# Patient Record
Sex: Female | Born: 1985 | Race: Black or African American | Hispanic: No | Marital: Single | Smoking: Never smoker
Health system: Southern US, Community
[De-identification: ages and names within clinical notes are randomized; demographics above are authoritative.]

## PROBLEM LIST (undated history)

## (undated) ENCOUNTER — Inpatient Hospital Stay (HOSPITAL_COMMUNITY): Payer: Self-pay

## (undated) DIAGNOSIS — A749 Chlamydial infection, unspecified: Secondary | ICD-10-CM

## (undated) DIAGNOSIS — IMO0002 Reserved for concepts with insufficient information to code with codable children: Secondary | ICD-10-CM

---
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
## 2009-12-11 ENCOUNTER — Inpatient Hospital Stay (HOSPITAL_COMMUNITY): Admission: AD | Admit: 2009-12-11 | Discharge: 2009-12-11 | Payer: Self-pay | Admitting: Family Medicine

## 2010-11-04 LAB — URINALYSIS, ROUTINE W REFLEX MICROSCOPIC
Ketones, ur: >80 mg/dL — AB
Nitrite: NEGATIVE
Protein, ur: NEGATIVE mg/dL

## 2011-09-26 ENCOUNTER — Encounter (HOSPITAL_COMMUNITY): Payer: Self-pay | Admitting: *Deleted

## 2011-09-26 ENCOUNTER — Inpatient Hospital Stay (HOSPITAL_COMMUNITY)
Admission: AD | Admit: 2011-09-26 | Discharge: 2011-09-27 | Disposition: A | Discharge status: Home / Self Care | Payer: No Typology Code available for payment source | Source: Ambulatory Visit | Attending: Obstetrics & Gynecology | Admitting: Obstetrics & Gynecology

## 2011-09-26 DIAGNOSIS — O368190 Decreased fetal movements, unspecified trimester, not applicable or unspecified: Secondary | ICD-10-CM | POA: Insufficient documentation

## 2011-09-26 DIAGNOSIS — G8911 Acute pain due to trauma: Secondary | ICD-10-CM

## 2011-09-26 HISTORY — DX: Reserved for concepts with insufficient information to code with codable children: IMO0002

## 2011-09-26 LAB — CBC
MCH: 30 pg (ref 26.0–34.0)
MCHC: 33.9 g/dL (ref 30.0–36.0)
Platelets: 244 10*3/uL (ref 150–400)
RDW: 13.8 % (ref 11.5–15.5)

## 2011-09-26 LAB — ABO/RH: ABO/RH(D): B POS
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
## 2011-09-27 MED ORDER — IBUPROFEN 200 MG PO TABS: 400.0000 mg | ORAL_TABLET | Freq: Three times a day (TID) | ORAL | Status: AC | PRN

## 2011-09-30 ENCOUNTER — Emergency Department (HOSPITAL_COMMUNITY)
Admission: EM | Admit: 2011-09-30 | Discharge: 2011-09-30 | Disposition: A | Discharge status: Home / Self Care | Payer: No Typology Code available for payment source | Attending: Emergency Medicine | Admitting: Emergency Medicine

## 2011-09-30 ENCOUNTER — Encounter (HOSPITAL_COMMUNITY): Payer: Self-pay | Admitting: Emergency Medicine

## 2011-09-30 DIAGNOSIS — M545 Low back pain, unspecified: Secondary | ICD-10-CM | POA: Insufficient documentation

## 2011-09-30 DIAGNOSIS — Y92410 Unspecified street and highway as the place of occurrence of the external cause: Secondary | ICD-10-CM | POA: Insufficient documentation
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
## 2012-01-21 ENCOUNTER — Telehealth (HOSPITAL_COMMUNITY): Payer: Self-pay | Admitting: *Deleted

## 2012-01-22 NOTE — Telephone Encounter (Signed)
Preadmission screen  

## 2012-09-27 ENCOUNTER — Encounter (HOSPITAL_COMMUNITY): Payer: Self-pay | Admitting: *Deleted

## 2012-09-27 ENCOUNTER — Emergency Department (HOSPITAL_COMMUNITY)
Admission: EM | Admit: 2012-09-27 | Discharge: 2012-09-27 | Disposition: A | Discharge status: Home / Self Care | Payer: Medicaid Other | Attending: Emergency Medicine | Admitting: Emergency Medicine

## 2012-09-27 DIAGNOSIS — Y9389 Activity, other specified: Secondary | ICD-10-CM | POA: Insufficient documentation

## 2012-09-27 DIAGNOSIS — S90569A Insect bite (nonvenomous), unspecified ankle, initial encounter: Secondary | ICD-10-CM | POA: Insufficient documentation

## 2012-09-27 DIAGNOSIS — Z8739 Personal history of other diseases of the musculoskeletal system and connective tissue: Secondary | ICD-10-CM | POA: Insufficient documentation

## 2012-09-27 DIAGNOSIS — Y929 Unspecified place or not applicable: Secondary | ICD-10-CM | POA: Insufficient documentation

## 2012-09-27 DIAGNOSIS — L509 Urticaria, unspecified: Secondary | ICD-10-CM

## 2012-09-27 DIAGNOSIS — Z87891 Personal history of nicotine dependence: Secondary | ICD-10-CM | POA: Insufficient documentation

## 2012-09-27 DIAGNOSIS — W57XXXA Bitten or stung by nonvenomous insect and other nonvenomous arthropods, initial encounter: Secondary | ICD-10-CM

## 2012-09-27 DIAGNOSIS — S60569A Insect bite (nonvenomous) of unspecified hand, initial encounter: Secondary | ICD-10-CM | POA: Insufficient documentation

## 2012-09-27 DIAGNOSIS — L299 Pruritus, unspecified: Secondary | ICD-10-CM | POA: Insufficient documentation

## 2012-09-27 MED ORDER — PERMETHRIN 5 % EX CREA: TOPICAL_CREAM | CUTANEOUS | Status: DC

## 2012-09-27 MED ORDER — DIPHENHYDRAMINE HCL 25 MG PO CAPS
50.0000 mg | ORAL_CAPSULE | Freq: Once | ORAL | Status: AC
  Administered 2012-09-27: 50 mg via ORAL
  Filled 2012-09-27: qty 2

## 2012-09-27 MED ORDER — HYDROCORTISONE 1 % EX CREA: TOPICAL_CREAM | Freq: Two times a day (BID) | CUTANEOUS | Status: DC

## 2012-09-27 MED ORDER — DIPHENHYDRAMINE HCL 25 MG PO TABS: 25.0000 mg | ORAL_TABLET | Freq: Four times a day (QID) | ORAL | Status: DC | PRN

## 2012-09-27 NOTE — ED Provider Notes (Signed)
Medical screening examination/treatment/procedure(s) were performed by non-physician practitioner and as supervising physician I was immediately available for consultation/collaboration.   Loren Racer, MD 09/27/12 2330

## 2012-09-27 NOTE — ED Notes (Signed)
Hives to lower legs for a couple of days; itch; today developed hives to arms and small patch to face; no shortness of breath or resp involvement

## 2012-09-27 NOTE — ED Provider Notes (Signed)
History  This chart was scribed for non-physician practitioner working with Loren Racer, MD by Candelaria Stagers, ED Scribe. This patient was seen in room WTR9/WTR9 and the patient's care was started at 10:07 PM   CSN: [id number]  Arrival date & time 09/27/12  2114   First MD Initiated Contact with Patient 09/27/12 2156      Chief Complaint  Patient presents with  . Urticaria     The history is provided by the patient. No language interpreter was used.   Veronica Zhang is a 27 y.o. female who presents to the Emergency Department complaining of an itching rash to her legs, arms, and face that started several days ago.  She reports she first noticed the rash on her lower right leg following left lower arm, left upper arm, and left side of her face.  Pt reports that she has been sleeping somewhere new recently, but has used her own bedding.  Nothing seems to make her sx better or worse. States that the itch is severe. Pt denies pregnancy and is not currently breastfeeding.      Past Medical History  Diagnosis Date  . Ligament tear of lower extremity     in RT knee    Past Surgical History  Procedure Laterality Date  . Tonsillectomy  1993    Family History  Problem Relation Age of Onset  . Anesthesia problems Neg Hx   . Hypotension Neg Hx   . Malignant hyperthermia Neg Hx   . Pseudochol deficiency Neg Hx     History  Substance Use Topics  . Smoking status: Former Games developer  . Smokeless tobacco: Not on file  . Alcohol Use: Yes     Comment: "Last consumption was over 9 months."    OB History   Grav Para Term Preterm Abortions TAB SAB Ect Mult Living   3    2 2     0      Review of Systems  Skin: Positive for rash (right leg, left arm, and left side of face).  All other systems reviewed and are negative.    Allergies  Penicillins  Home Medications  No current outpatient prescriptions on file.  BP 121/78  Pulse 66  Temp(Src) 98.6 F (37 C) (Oral)  Resp 18   Ht 5\' 7"  (1.702 m)  Wt 128 lb (58.06 kg)  BMI 20.04 kg/m2  SpO2 100%  LMP 09/20/2012  Breastfeeding? Unknown  Physical Exam  Nursing note and vitals reviewed. Constitutional: She is oriented to person, place, and time. She appears well-developed and well-nourished. No distress.  HENT:  Head: Normocephalic and atraumatic.  Eyes: EOM are normal.  Neck: Neck supple. No tracheal deviation present.  Cardiovascular: Normal rate.   Pulmonary/Chest: Effort normal. No respiratory distress.  Musculoskeletal: Normal range of motion.  Neurological: She is alert and oriented to person, place, and time.  Skin: Skin is warm and dry.  Multiple 2 x 2 cm bites with surrounding redness scattered over lower extremities and upper extremities.    Psychiatric: She has a normal mood and affect. Her behavior is normal.    ED Course  Procedures   DIAGNOSTIC STUDIES: Oxygen Saturation is 100% on room air, normal by my interpretation.    COORDINATION OF CARE:  10:12 PM Will prescribe permethrin cream to treat for bed bugs.  Advised pt to return if sx have not improved in two weeks or she begins to experience difficulty breathing or worsening symptoms.  Pt understands and agrees.  Labs Reviewed - No data to display No results found.   1. Bug bites   2. Urticaria       MDM  Patient's rash looks like bedbugs scabies, will treat with permethrin hydrocortisone cream. Also encouraged patient to wash her sheets and take Benadryl for symptoms. I personally performed the services described in this documentation, which was scribed in my presence. The recorded information has been reviewed and is accurate.         Roxy Horseman, PA-C 09/27/12 2240

## 2012-10-29 ENCOUNTER — Emergency Department (HOSPITAL_COMMUNITY)
Admission: EM | Admit: 2012-10-29 | Discharge: 2012-10-29 | Disposition: A | Discharge status: Home Health | Payer: Medicaid Other | Attending: Emergency Medicine | Admitting: Emergency Medicine

## 2012-10-29 ENCOUNTER — Encounter (HOSPITAL_COMMUNITY): Payer: Self-pay | Admitting: Emergency Medicine

## 2012-10-29 ENCOUNTER — Emergency Department (HOSPITAL_COMMUNITY): Payer: Medicaid Other

## 2012-10-29 DIAGNOSIS — R42 Dizziness and giddiness: Secondary | ICD-10-CM | POA: Insufficient documentation

## 2012-10-29 DIAGNOSIS — R531 Weakness: Secondary | ICD-10-CM

## 2012-10-29 DIAGNOSIS — R296 Repeated falls: Secondary | ICD-10-CM | POA: Insufficient documentation

## 2012-10-29 DIAGNOSIS — Y929 Unspecified place or not applicable: Secondary | ICD-10-CM | POA: Insufficient documentation

## 2012-10-29 DIAGNOSIS — R34 Anuria and oliguria: Secondary | ICD-10-CM | POA: Insufficient documentation

## 2012-10-29 DIAGNOSIS — IMO0002 Reserved for concepts with insufficient information to code with codable children: Secondary | ICD-10-CM | POA: Insufficient documentation

## 2012-10-29 DIAGNOSIS — Y9389 Activity, other specified: Secondary | ICD-10-CM | POA: Insufficient documentation

## 2012-10-29 DIAGNOSIS — R5381 Other malaise: Secondary | ICD-10-CM | POA: Insufficient documentation

## 2012-10-29 DIAGNOSIS — N390 Urinary tract infection, site not specified: Secondary | ICD-10-CM | POA: Insufficient documentation

## 2012-10-29 DIAGNOSIS — S62619A Displaced fracture of proximal phalanx of unspecified finger, initial encounter for closed fracture: Secondary | ICD-10-CM

## 2012-10-29 DIAGNOSIS — Z3202 Encounter for pregnancy test, result negative: Secondary | ICD-10-CM | POA: Insufficient documentation

## 2012-10-29 DIAGNOSIS — Z87828 Personal history of other (healed) physical injury and trauma: Secondary | ICD-10-CM | POA: Insufficient documentation

## 2012-10-29 LAB — CBC
Hemoglobin: 14.1 g/dL (ref 12.0–15.0)
MCH: 28.9 pg (ref 26.0–34.0)
MCHC: 34.7 g/dL (ref 30.0–36.0)
Platelets: 246 10*3/uL (ref 150–400)
RDW: 13.5 % (ref 11.5–15.5)

## 2012-10-29 LAB — URINE MICROSCOPIC-ADD ON

## 2012-10-29 LAB — POCT PREGNANCY, URINE: Preg Test, Ur: NEGATIVE

## 2012-10-29 LAB — POCT I-STAT, CHEM 8
BUN: 14 mg/dL (ref 6–23)
Calcium, Ion: 1.17 mmol/L (ref 1.12–1.23)
TCO2: 24 mmol/L (ref 0–100)

## 2012-10-29 LAB — URINALYSIS, ROUTINE W REFLEX MICROSCOPIC
Bilirubin Urine: NEGATIVE
Ketones, ur: 15 mg/dL — AB
Nitrite: NEGATIVE
Protein, ur: 30 mg/dL — AB
Urobilinogen, UA: 1 mg/dL (ref 0.0–1.0)

## 2012-10-29 MED ORDER — SULFAMETHOXAZOLE-TRIMETHOPRIM 800-160 MG PO TABS: 1.0000 | ORAL_TABLET | Freq: Two times a day (BID) | ORAL | Status: DC

## 2012-10-29 MED ORDER — IBUPROFEN 800 MG PO TABS
800.0000 mg | ORAL_TABLET | Freq: Once | ORAL | Status: AC
  Administered 2012-10-29: 800 mg via ORAL
  Filled 2012-10-29: qty 1

## 2012-10-29 MED ORDER — SODIUM CHLORIDE 0.9 % IV BOLUS (SEPSIS)
1000.0000 mL | Freq: Once | INTRAVENOUS | Status: AC
  Administered 2012-10-29: 1000 mL via INTRAVENOUS

## 2012-10-29 MED ORDER — TRAMADOL HCL 50 MG PO TABS: 50.0000 mg | ORAL_TABLET | Freq: Four times a day (QID) | ORAL | Status: DC | PRN

## 2012-10-29 MED ORDER — LORAZEPAM 1 MG PO TABS
0.5000 mg | ORAL_TABLET | Freq: Once | ORAL | Status: AC
  Administered 2012-10-29: 0.5 mg via ORAL
  Filled 2012-10-29: qty 1

## 2012-10-29 NOTE — Progress Notes (Signed)
Orthopedic Tech Progress Note Patient Details:  Veronica Zhang 1985/12/16 [id number]  Ortho Devices Type of Ortho Device: Finger splint Ortho Device/Splint Location: right hand Ortho Device/Splint Interventions: Application   Jacqulyne Gladue 10/29/2012, 2:17 PM
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
## 2012-10-29 NOTE — ED Provider Notes (Signed)
Medical screening examination/treatment/procedure(s) were performed by non-physician practitioner and as supervising physician I was immediately available for consultation/collaboration.   Celene Kras, MD 10/29/12 1356

## 2012-10-29 NOTE — ED Notes (Signed)
Pt discharged to home with family. NAD.  
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
## 2012-10-31 LAB — URINE CULTURE: Colony Count: 50000

## 2014-02-16 ENCOUNTER — Inpatient Hospital Stay (HOSPITAL_COMMUNITY)
Admission: AD | Admit: 2014-02-16 | Discharge: 2014-02-16 | Disposition: A | Discharge status: Home / Self Care | Payer: Medicaid Other | Source: Ambulatory Visit | Attending: Obstetrics and Gynecology | Admitting: Obstetrics and Gynecology

## 2014-02-16 ENCOUNTER — Encounter (HOSPITAL_COMMUNITY): Payer: Self-pay | Admitting: *Deleted

## 2014-02-16 ENCOUNTER — Inpatient Hospital Stay (HOSPITAL_COMMUNITY): Payer: Medicaid Other

## 2014-02-16 DIAGNOSIS — R109 Unspecified abdominal pain: Secondary | ICD-10-CM | POA: Insufficient documentation

## 2014-02-16 DIAGNOSIS — O208 Other hemorrhage in early pregnancy: Secondary | ICD-10-CM | POA: Insufficient documentation

## 2014-02-16 DIAGNOSIS — O209 Hemorrhage in early pregnancy, unspecified: Secondary | ICD-10-CM | POA: Insufficient documentation

## 2014-02-16 DIAGNOSIS — O468X1 Other antepartum hemorrhage, first trimester: Secondary | ICD-10-CM

## 2014-02-16 DIAGNOSIS — O418X10 Other specified disorders of amniotic fluid and membranes, first trimester, not applicable or unspecified: Secondary | ICD-10-CM

## 2014-02-16 HISTORY — DX: Chlamydial infection, unspecified: A74.9

## 2014-02-16 LAB — URINE MICROSCOPIC-ADD ON

## 2014-02-16 LAB — CBC
HEMATOCRIT: 35.4 % — AB (ref 36.0–46.0)
Hemoglobin: 12.1 g/dL (ref 12.0–15.0)
MCH: 28.5 pg (ref 26.0–34.0)
MCHC: 34.2 g/dL (ref 30.0–36.0)
MCV: 83.5 fL (ref 78.0–100.0)
PLATELETS: 283 10*3/uL (ref 150–400)
RBC: 4.24 MIL/uL (ref 3.87–5.11)
RDW: 13.5 % (ref 11.5–15.5)
WBC: 8.9 10*3/uL (ref 4.0–10.5)

## 2014-02-16 LAB — WET PREP, GENITAL
Trich, Wet Prep: NONE SEEN
YEAST WET PREP: NONE SEEN

## 2014-02-16 LAB — URINALYSIS, ROUTINE W REFLEX MICROSCOPIC
BILIRUBIN URINE: NEGATIVE
GLUCOSE, UA: NEGATIVE mg/dL
Ketones, ur: NEGATIVE mg/dL
Leukocytes, UA: NEGATIVE
NITRITE: NEGATIVE
PH: 6 (ref 5.0–8.0)
Protein, ur: NEGATIVE mg/dL
SPECIFIC GRAVITY, URINE: 1.025 (ref 1.005–1.030)
Urobilinogen, UA: 1 mg/dL (ref 0.0–1.0)

## 2014-02-16 LAB — POCT PREGNANCY, URINE: Preg Test, Ur: POSITIVE — AB

## 2014-02-16 LAB — HCG, QUANTITATIVE, PREGNANCY: hCG, Beta Chain, Quant, S: 9255 m[IU]/mL — ABNORMAL HIGH (ref <5)

## 2014-02-16 MED ORDER — GNP PRENATAL VITAMINS 28-0.8 MG PO TABS: 1.0000 | ORAL_TABLET | Freq: Every day | ORAL | Status: DC

## 2014-02-16 NOTE — MAU Provider Note (Signed)
None     Chief Complaint:  Possible Pregnancy, Abdominal Pain and Vaginal Bleeding   Veronica Zhang is  28 y.o. [phone] at 557w2d presents complaining of Possible Pregnancy, Abdominal Pain and Vaginal Bleeding .  She states none contractions are associated with minimal vaginal bleeding, intact membranes, along with None fetal movement. She says that she found out she was pregnant by UPT 2 wks. Previously. She then experienced vaginal bleeding x 2 days with day 1 pink staining, day 2 frank blood. Last sexual contact 2.5 weeks. She denies recent hx of STI. She admits to mild cramps similar but less intense than menstrual cramps. She denies frank abdominal pain, nausea, vomiting, fever, chills, trauma. She has no other complaints.   Obstetrical/Gynecological History: OB History   Grav Para Term Preterm Abortions TAB SAB Ect Mult Living   4 1 1  2 2    1      Past Medical History: Past Medical History  Diagnosis Date  . Ligament tear of lower extremity     in RT knee  . Chlamydia     Past Surgical History: Past Surgical History  Procedure Laterality Date  . Tonsillectomy  1993    Family History: Family History  Problem Relation Age of Onset  . Anesthesia problems Neg Hx   . Hypotension Neg Hx   . Malignant hyperthermia Neg Hx   . Pseudochol deficiency Neg Hx     Social History: History  Substance Use Topics  . Smoking status: Never Smoker   . Smokeless tobacco: Not on file  . Alcohol Use: Yes     Comment: "Last consumption was over 9 months."    Allergies:  Allergies  Allergen Reactions  . Penicillins Other (See Comments)    Unknown childhood rxn    Meds:  Prescriptions prior to admission  Medication Sig Dispense Refill  . ibuprofen (ADVIL,MOTRIN) 200 MG tablet Take 400 mg by mouth every 6 (six) hours as needed for moderate pain.        Review of Systems -   Review of Systems  Per HPI above.    Physical Exam  Blood pressure 107/63, pulse 59, temperature  98.7 F (37.1 C), temperature source Oral, resp. rate 18, height 5' 6.5" (1.689 m), weight 58.968 kg (130 lb), last menstrual period 12/27/2013. GENERAL: Well-developed, well-nourished female in no acute distress.  LUNGS: Clear to auscultation bilaterally.  HEART: Regular rate and rhythm. ABDOMEN: Soft, nontender, nondistended, gravid.  EXTREMITIES: Nontender, no edema, 2+ distal pulses. DTR's 2+ CERVICAL EXAM: Dilatation closed, thick, high. Speculum exam revealed blood in vaginal vault with blood oozing from cervical os.  Presentation: early pregnancy    Labs: Results for orders placed during the hospital encounter of 02/16/14 (from the past 24 hour(s))  URINALYSIS, ROUTINE W REFLEX MICROSCOPIC   Collection Time    02/16/14  5:34 PM      Result Value Ref Range   Color, Urine YELLOW  YELLOW   APPearance CLEAR  CLEAR   Specific Gravity, Urine 1.025  1.005 - 1.030   pH 6.0  5.0 - 8.0   Glucose, UA NEGATIVE  NEGATIVE mg/dL   Hgb urine dipstick SMALL (*) NEGATIVE   Bilirubin Urine NEGATIVE  NEGATIVE   Ketones, ur NEGATIVE  NEGATIVE mg/dL   Protein, ur NEGATIVE  NEGATIVE mg/dL   Urobilinogen, UA 1.0  0.0 - 1.0 mg/dL   Nitrite NEGATIVE  NEGATIVE   Leukocytes, UA NEGATIVE  NEGATIVE  URINE MICROSCOPIC-ADD ON   Collection  Time    02/16/14  5:34 PM      Result Value Ref Range   Squamous Epithelial / LPF FEW (*) RARE   WBC, UA 0-2  <3 WBC/hpf   RBC / HPF 3-6  <3 RBC/hpf   Bacteria, UA RARE  RARE   Urine-Other MUCOUS PRESENT    POCT PREGNANCY, URINE   Collection Time    02/16/14  5:37 PM      Result Value Ref Range   Preg Test, Ur POSITIVE (*) NEGATIVE  CBC   Collection Time    02/16/14  7:00 PM      Result Value Ref Range   WBC 8.9  4.0 - 10.5 K/uL   RBC 4.24  3.87 - 5.11 MIL/uL   Hemoglobin 12.1  12.0 - 15.0 g/dL   HCT 16.135.4 (*) 09.636.0 - 04.546.0 %   MCV 83.5  78.0 - 100.0 fL   MCH 28.5  26.0 - 34.0 pg   MCHC 34.2  30.0 - 36.0 g/dL   RDW 40.913.5  81.111.5 - 91.415.5 %   Platelets 283   150 - 400 K/uL  HCG, QUANTITATIVE, PREGNANCY   Collection Time    02/16/14  7:00 PM      Result Value Ref Range   hCG, Beta Chain, Quant, S 9255 (*) <5 mIU/mL  WET PREP, GENITAL   Collection Time    02/16/14  7:30 PM      Result Value Ref Range   Yeast Wet Prep HPF POC NONE SEEN  NONE SEEN   Trich, Wet Prep NONE SEEN  NONE SEEN   Clue Cells Wet Prep HPF POC FEW (*) NONE SEEN   WBC, Wet Prep HPF POC FEW (*) NONE SEEN   Imaging Studies:  CLINICAL DATA: 28 year old G4 P1 AB2, unknown LMP, presenting with  vaginal bleeding. Quantitative beta HCG is pending.  EXAM:  OBSTETRIC <14 WK US AND TRANSVAGINAL OB US  TECHNIQUE:  Both transabdominal and transvaginal ultrasound examinations were  performed for complete evaluation of the gestation as well as the  maternal uterus, adnexal regions, and pelvic cul-de-sac.  Transvaginal technique was performed to assess early pregnancy.  COMPARISON: None.  FINDINGS:  Intrauterine gestational sac: Single, normal in appearance.  Yolk sac: Identified.  Embryo: Not identified.  Cardiac Activity: Not applicable.  MSD: 13.9 mm 6 w 1 d  US EDC: 10/11/2014  Maternal uterus/adnexae: Small subchorionic hemorrhage. Both ovaries  normal in size and appearance, containing small follicular cysts.  Trace free fluid in the cul-de-sac.  IMPRESSION:  1. Early intrauterine gestational sac and yolk sac, but no fetal  pole or cardiac activity yet visualized. Please correlate with the  current beta HCG level. Recommend follow-up quantitative B-HCG  levels and follow-up US in 14 days to confirm and assess viability.  This recommendation follows SRU consensus guidelines: Diagnostic  Criteria for Nonviable Pregnancy Early in the First Trimester. Malva Limes  Engl J Med 2013; 782:9562-13; 369:1443-51.  2. Small subchorionic hemorrhage.  3. Normal-appearing ovaries. No adnexal masses. Trace free fluid in  the cul-de-sac.  Electronically Signed  By: Hulan Saashomas Lawrence M.D.  On: 02/16/2014  20:03  Assessment: Veronica Zhang is  28 y.o. [phone] [id number] at 8043w2d presents with vaginal bleeding x 2 days with mild menstrual cramping.   Plan: Pt with subchorionic hemorrhage. Will follow up quant in 48 hrs to ensure increasing. Pt to follow up in clinic to establish care and will repeat US in 2 weeks as long it they continue to increase.  Tawana Scale 7/3/20158:28 PM

## 2014-02-16 NOTE — MAU Note (Signed)
+  HPT 2 wks ago.  This wk started bleeding yesterday. Cramping at times

## 2014-02-16 NOTE — Discharge Instructions (Signed)

## 2014-02-18 ENCOUNTER — Inpatient Hospital Stay (HOSPITAL_COMMUNITY)
Admission: AD | Admit: 2014-02-18 | Discharge: 2014-02-18 | Disposition: A | Discharge status: Home / Self Care | Payer: Medicaid Other | Source: Ambulatory Visit | Attending: Obstetrics and Gynecology | Admitting: Obstetrics and Gynecology

## 2014-02-18 ENCOUNTER — Encounter (HOSPITAL_COMMUNITY): Payer: Self-pay | Admitting: General Practice

## 2014-02-18 DIAGNOSIS — O209 Hemorrhage in early pregnancy, unspecified: Secondary | ICD-10-CM | POA: Diagnosis present

## 2014-02-18 DIAGNOSIS — O039 Complete or unspecified spontaneous abortion without complication: Secondary | ICD-10-CM | POA: Diagnosis not present

## 2014-02-18 LAB — HCG, QUANTITATIVE, PREGNANCY: hCG, Beta Chain, Quant, S: 9152 m[IU]/mL — ABNORMAL HIGH (ref <5)

## 2014-02-18 MED ORDER — HYDROCODONE-ACETAMINOPHEN 5-325 MG PO TABS: 1.0000 | ORAL_TABLET | Freq: Four times a day (QID) | ORAL | Status: DC | PRN

## 2014-02-18 MED ORDER — PROMETHAZINE HCL 25 MG PO TABS: 25.0000 mg | ORAL_TABLET | Freq: Four times a day (QID) | ORAL | Status: DC | PRN

## 2014-02-18 NOTE — Discharge Instructions (Signed)
Miscarriage A miscarriage is the sudden loss of an unborn baby (fetus) before the 20th week of pregnancy. Most miscarriages happen in the first 3 months of pregnancy. Sometimes, it happens before a woman even knows she is pregnant. A miscarriage is also called a "spontaneous miscarriage" or "early pregnancy loss." Having a miscarriage can be an emotional experience. Talk with your caregiver about any questions you may have about miscarrying, the grieving process, and your future pregnancy plans. CAUSES   Problems with the fetal chromosomes that make it impossible for the baby to develop normally. Problems with the baby's genes or chromosomes are most often the result of errors that occur, by chance, as the embryo divides and grows. The problems are not inherited from the parents.  Infection of the cervix or uterus.   Hormone problems.   Problems with the cervix, such as having an incompetent cervix. This is when the tissue in the cervix is not strong enough to hold the pregnancy.   Problems with the uterus, such as an abnormally shaped uterus, uterine fibroids, or congenital abnormalities.   Certain medical conditions.   Smoking, drinking alcohol, or taking illegal drugs.   Trauma.  Often, the cause of a miscarriage is unknown.  SYMPTOMS   Vaginal bleeding or spotting, with or without cramps or pain.  Pain or cramping in the abdomen or lower back.  Passing fluid, tissue, or blood clots from the vagina. DIAGNOSIS  Your caregiver will perform a physical exam. You may also have an ultrasound to confirm the miscarriage. Blood or urine tests may also be ordered. TREATMENT   Sometimes, treatment is not necessary if you naturally pass all the fetal tissue that was in the uterus. If some of the fetus or placenta remains in the body (incomplete miscarriage), tissue left behind may become infected and must be removed. Usually, a dilation and curettage (D and C) procedure is performed.  During a D and C procedure, the cervix is widened (dilated) and any remaining fetal or placental tissue is gently removed from the uterus.  Antibiotic medicines are prescribed if there is an infection. Other medicines may be given to reduce the size of the uterus (contract) if there is a lot of bleeding.  If you have Rh negative blood and your baby was Rh positive, you will need a Rh immunoglobulin shot. This shot will protect any future baby from having Rh blood problems in future pregnancies. HOME CARE INSTRUCTIONS   Your caregiver may order bed rest or may allow you to continue light activity. Resume activity as directed by your caregiver.  Have someone help with home and family responsibilities during this time.   Keep track of the number of sanitary pads you use each day and how soaked (saturated) they are. Write down this information.   Do not use tampons. Do not douche or have sexual intercourse until approved by your caregiver.   Only take over-the-counter or prescription medicines for pain or discomfort as directed by your caregiver.   Do not take aspirin. Aspirin can cause bleeding.   Keep all follow-up appointments with your caregiver.   If you or your partner have problems with grieving, talk to your caregiver or seek counseling to help cope with the pregnancy loss. Allow enough time to grieve before trying to get pregnant again.  SEEK IMMEDIATE MEDICAL CARE IF:   You have severe cramps or pain in your back or abdomen.  You have a fever.  You pass large blood clots (walnut-sized   or larger) ortissue from your vagina. Save any tissue for your caregiver to inspect.   Your bleeding increases.   You have a thick, bad-smelling vaginal discharge.  You become lightheaded, weak, or you faint.   You have chills.  MAKE SURE YOU:  Understand these instructions.  Will watch your condition.  Will get help right away if you are not doing well or get  worse. Document Released: 01/27/2001 Document Revised: 11/28/2012 Document Reviewed: 09/22/2011 ExitCare Patient Information 2015 [employer]. This information is not intended to replace advice given to you by your health care provider. Make sure you discuss any questions you have with your health care provider.  

## 2014-02-18 NOTE — MAU Provider Note (Signed)
Attestation of Attending Supervision of Advanced Practitioner (CNM/NP): Evaluation and management procedures were performed by the Advanced Practitioner under my supervision and collaboration.  I have reviewed the Advanced Practitioner's note and chart, and I agree with the management and plan.  Vanessia Bokhari 02/18/2014 8:32 PM   

## 2014-02-18 NOTE — MAU Note (Signed)
Pt presents for repeat BHCG. Denies pain, reports small amount of bright red vaginal bleeding

## 2014-02-18 NOTE — MAU Provider Note (Signed)
  History     CSN: [id number]  Arrival date and time: 02/18/14 1619   None     Chief Complaint  Patient presents with  . Labs Only   HPI  Veronica Zhang is a 28 y.o. [id number] at 947w4d who presents today for FU HCG. She states that she is still having bleeding that is the same as it was two days ago. She states that is has not increased or decreased. She reports minimal cramping.   Past Medical History  Diagnosis Date  . Ligament tear of lower extremity     in RT knee  . Chlamydia     Past Surgical History  Procedure Laterality Date  . Tonsillectomy  1993    Family History  Problem Relation Age of Onset  . Anesthesia problems Neg Hx   . Hypotension Neg Hx   . Malignant hyperthermia Neg Hx   . Pseudochol deficiency Neg Hx     History  Substance Use Topics  . Smoking status: Never Smoker   . Smokeless tobacco: Not on file  . Alcohol Use: Yes     Comment: "Last consumption was over 9 months."    Allergies:  Allergies  Allergen Reactions  . Penicillins Other (See Comments)    Unknown childhood rxn    Prescriptions prior to admission  Medication Sig Dispense Refill  . Prenatal Vit-Fe Fumarate-FA (GNP PRENATAL VITAMINS) 28-0.8 MG TABS Take 1 tablet by mouth daily.  60 tablet  1    ROS Physical Exam   Blood pressure 116/62, pulse 56, resp. rate 16, last menstrual period 12/27/2013.  Physical Exam  Nursing note and vitals reviewed. Constitutional: She is oriented to person, place, and time. She appears well-developed and well-nourished. No distress.  Cardiovascular: Normal rate.   Respiratory: Effort normal.  GI: Soft. There is no tenderness. There is no rebound.  Neurological: She is alert and oriented to person, place, and time.  Skin: Skin is warm and dry.  Psychiatric: She has a normal mood and affect.    MAU Course  Procedures  Results for Veronica RutherfordDORSEY, Veronica L (MRN [id number]) as of 02/18/2014 17:26  Ref. Range 02/16/2014 19:00 02/16/2014 19:30 02/16/2014  19:54 02/18/2014 16:35  hCG, Beta Chain, Quant, S Latest Range: <5 mIU/mL 9255 (H)   9152 (H)    Assessment and Plan   1. SAB (spontaneous abortion)    Bleeding precauitons Offered cytotec today, patient is unsure Will FU in the clinic in one week to discuss Cytotec if SAB is not progressing    Medication List         HYDROcodone-acetaminophen 5-325 MG per tablet  Commonly known as:  NORCO/VICODIN  Take 1 tablet by mouth every 6 (six) hours as needed for moderate pain.     prenatal multivitamin Tabs tablet  Take 1 tablet by mouth daily.     promethazine 25 MG tablet  Commonly known as:  PHENERGAN  Take 1 tablet (25 mg total) by mouth every 6 (six) hours as needed for nausea or vomiting.       Ibuprofen PRN as well  Follow-up Information   [facility] (MONDAY 02/26/14 at 1:00 PM )    Specialty:  Obstetrics and Gynecology   Contact information:   [address] [place] [address] [phone]       Tawnya CrookHogan, Arvis Zwahlen Donovan 02/18/2014, 5:28 PM

## 2014-02-18 NOTE — MAU Provider Note (Signed)
Attestation of Attending Supervision of Advanced Practitioner (CNM/NP): Evaluation and management procedures were performed by the Advanced Practitioner under my supervision and collaboration.  I have reviewed the Advanced Practitioner's note and chart, and I agree with the management and plan.  Cadance Raus 02/18/2014 8:32 PM   

## 2014-02-19 LAB — GC/CHLAMYDIA PROBE AMP
CT PROBE, AMP APTIMA: NEGATIVE
GC PROBE AMP APTIMA: NEGATIVE

## 2014-02-26 ENCOUNTER — Ambulatory Visit: Payer: Medicaid Other | Admitting: Family Medicine

## 2014-03-07 ENCOUNTER — Ambulatory Visit (INDEPENDENT_AMBULATORY_CARE_PROVIDER_SITE_OTHER): Payer: Medicaid Other | Admitting: Obstetrics & Gynecology

## 2014-03-07 ENCOUNTER — Encounter: Payer: Self-pay | Admitting: Obstetrics & Gynecology

## 2014-03-07 VITALS — BP 103/59 | HR 62 | Temp 98.3°F | Ht 66.0 in | Wt 127.9 lb

## 2014-03-07 DIAGNOSIS — Z3009 Encounter for other general counseling and advice on contraception: Secondary | ICD-10-CM | POA: Insufficient documentation

## 2014-03-07 DIAGNOSIS — O039 Complete or unspecified spontaneous abortion without complication: Secondary | ICD-10-CM

## 2014-03-07 MED ORDER — ETONOGESTREL-ETHINYL ESTRADIOL 0.12-0.015 MG/24HR VA RING: VAGINAL_RING | VAGINAL | Status: DC

## 2014-03-07 NOTE — Patient Instructions (Signed)
Miscarriage A miscarriage is the sudden loss of an unborn baby (fetus) before the 20th week of pregnancy. Most miscarriages happen in the first 3 months of pregnancy. Sometimes, it happens before a woman even knows she is pregnant. A miscarriage is also called a "spontaneous miscarriage" or "early pregnancy loss." Having a miscarriage can be an emotional experience. Talk with your caregiver about any questions you may have about miscarrying, the grieving process, and your future pregnancy plans. CAUSES   Problems with the fetal chromosomes that make it impossible for the baby to develop normally. Problems with the baby's genes or chromosomes are most often the result of errors that occur, by chance, as the embryo divides and grows. The problems are not inherited from the parents.  Infection of the cervix or uterus.   Hormone problems.   Problems with the cervix, such as having an incompetent cervix. This is when the tissue in the cervix is not strong enough to hold the pregnancy.   Problems with the uterus, such as an abnormally shaped uterus, uterine fibroids, or congenital abnormalities.   Certain medical conditions.   Smoking, drinking alcohol, or taking illegal drugs.   Trauma.  Often, the cause of a miscarriage is unknown.  SYMPTOMS   Vaginal bleeding or spotting, with or without cramps or pain.  Pain or cramping in the abdomen or lower back.  Passing fluid, tissue, or blood clots from the vagina. DIAGNOSIS  Your caregiver will perform a physical exam. You may also have an ultrasound to confirm the miscarriage. Blood or urine tests may also be ordered. TREATMENT   Sometimes, treatment is not necessary if you naturally pass all the fetal tissue that was in the uterus. If some of the fetus or placenta remains in the body (incomplete miscarriage), tissue left behind may become infected and must be removed. Usually, a dilation and curettage (D and C) procedure is performed.  During a D and C procedure, the cervix is widened (dilated) and any remaining fetal or placental tissue is gently removed from the uterus.  Antibiotic medicines are prescribed if there is an infection. Other medicines may be given to reduce the size of the uterus (contract) if there is a lot of bleeding.  If you have Rh negative blood and your baby was Rh positive, you will need a Rh immunoglobulin shot. This shot will protect any future baby from having Rh blood problems in future pregnancies. HOME CARE INSTRUCTIONS   Your caregiver may order bed rest or may allow you to continue light activity. Resume activity as directed by your caregiver.  Have someone help with home and family responsibilities during this time.   Keep track of the number of sanitary pads you use each day and how soaked (saturated) they are. Write down this information.   Do not use tampons. Do not douche or have sexual intercourse until approved by your caregiver.   Only take over-the-counter or prescription medicines for pain or discomfort as directed by your caregiver.   Do not take aspirin. Aspirin can cause bleeding.   Keep all follow-up appointments with your caregiver.   If you or your partner have problems with grieving, talk to your caregiver or seek counseling to help cope with the pregnancy loss. Allow enough time to grieve before trying to get pregnant again.  SEEK IMMEDIATE MEDICAL CARE IF:   You have severe cramps or pain in your back or abdomen.  You have a fever.  You pass large blood clots (walnut-sized   or larger) ortissue from your vagina. Save any tissue for your caregiver to inspect.   Your bleeding increases.   You have a thick, bad-smelling vaginal discharge.  You become lightheaded, weak, or you faint.   You have chills.  MAKE SURE YOU:  Understand these instructions.  Will watch your condition.  Will get help right away if you are not doing well or get  worse. Document Released: 01/27/2001 Document Revised: 11/28/2012 Document Reviewed: 09/22/2011 ExitCare Patient Information 2015 [employer]. This information is not intended to replace advice given to you by your health care provider. Make sure you discuss any questions you have with your health care provider.  

## 2014-03-07 NOTE — Progress Notes (Signed)
Patient ID: Veronica Zhang, female   DOB: September 19, 1985, 28 y.o.   MRN: [id number]  Chief Complaint  Patient presents with  . Follow-up    possible sab    HPI Veronica Zhang is a 28 y.o. female.  [id number] She was seen 02/18/14 with threatened Ab, and she had passage of clots 4 days later and no pain or bleeding now. [facility]  HPI  Past Medical History  Diagnosis Date  . Ligament tear of lower extremity     in RT knee  . Chlamydia     Past Surgical History  Procedure Laterality Date  . Tonsillectomy  1993    Family History  Problem Relation Age of Onset  . Anesthesia problems Neg Hx   . Hypotension Neg Hx   . Malignant hyperthermia Neg Hx   . Pseudochol deficiency Neg Hx     Social History History  Substance Use Topics  . Smoking status: Never Smoker   . Smokeless tobacco: Not on file  . Alcohol Use: Yes     Comment: "Last consumption was over 9 months."    Allergies  Allergen Reactions  . Penicillins Other (See Comments)    Unknown, childhood reaction.     Current Outpatient Prescriptions  Medication Sig Dispense Refill  . etonogestrel-ethinyl estradiol (NUVARING) 0.12-0.015 MG/24HR vaginal ring Insert vaginally and leave in place for 3 consecutive weeks, then remove for 1 week.  1 each  12  . HYDROcodone-acetaminophen (NORCO/VICODIN) 5-325 MG per tablet Take 1 tablet by mouth every 6 (six) hours as needed for moderate pain.  20 tablet  0  . Prenatal Vit-Fe Fumarate-FA (PRENATAL MULTIVITAMIN) TABS tablet Take 1 tablet by mouth daily.      . promethazine (PHENERGAN) 25 MG tablet Take 1 tablet (25 mg total) by mouth every 6 (six) hours as needed for nausea or vomiting.  30 tablet  0   No current facility-administered medications for this visit.    Review of Systems Review of Systems  Constitutional: Negative.   Genitourinary: Negative for dysuria, vaginal bleeding, vaginal discharge and menstrual problem.    Blood pressure 103/59, pulse 62,  temperature 98.3 F (36.8 C), height 5\' 6"  (1.676 m), weight 127 lb 14.4 oz (58.015 kg), last menstrual period 12/27/2013.  Physical Exam Physical Exam  Constitutional: She appears well-developed. No distress.  Pulmonary/Chest: Effort normal. No respiratory distress.  Abdominal: She exhibits no distension and no mass. There is no tenderness.  Skin: Skin is warm and dry.  Psychiatric: She has a normal mood and affect. Her behavior is normal.    Data Reviewed US and notes  Assessment    S/P complete Ab     Plan    Nuvaring        Veronica Zhang 03/07/2014, 2:28 PM

## 2014-06-18 ENCOUNTER — Encounter: Payer: Self-pay | Admitting: Obstetrics & Gynecology

## 2014-12-22 ENCOUNTER — Encounter (HOSPITAL_COMMUNITY): Payer: Self-pay | Admitting: *Deleted

## 2015-04-05 IMAGING — US US OB COMP LESS 14 WK
1 series · 13 of 28 positions shown · non-contrast
Comparison: None.

CLINICAL DATA: 27-year-old G4 P1 AB2, unknown LMP, presenting with
vaginal bleeding. Quantitative beta HCG is pending.

EXAM:
OBSTETRIC <14 WK US AND TRANSVAGINAL OB US
TECHNIQUE: Both transabdominal and transvaginal ultrasound examinations were
performed for complete evaluation of the gestation as well as the
maternal uterus, adnexal regions, and pelvic cul-de-sac.
Transvaginal technique was performed to assess early pregnancy.

[Series 1: us ob comp less 14 wks · 13 of 64 slices shown]
[im 3/64]
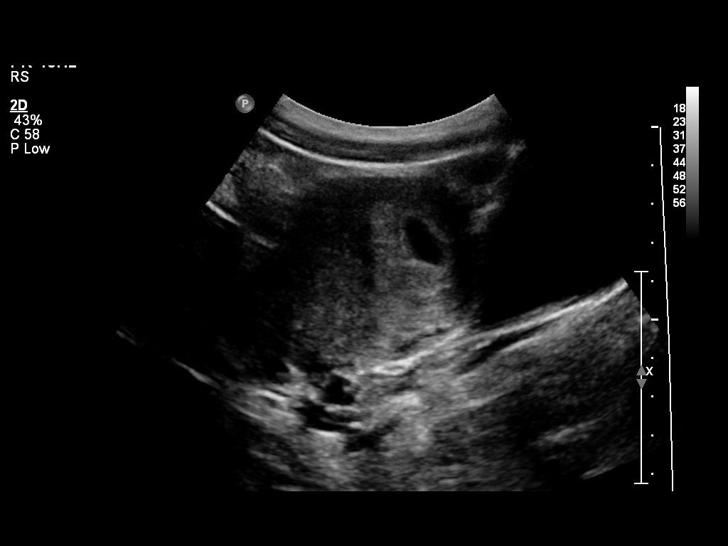
[im 8/64]
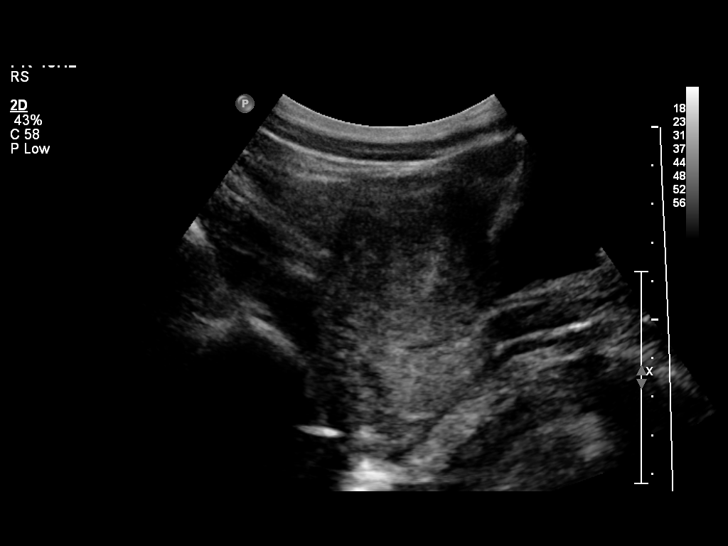
[im 12/64]
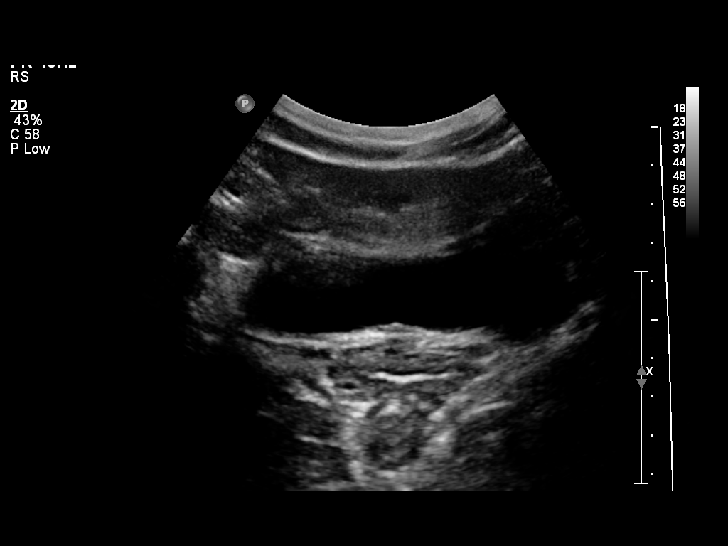
[im 17/64]
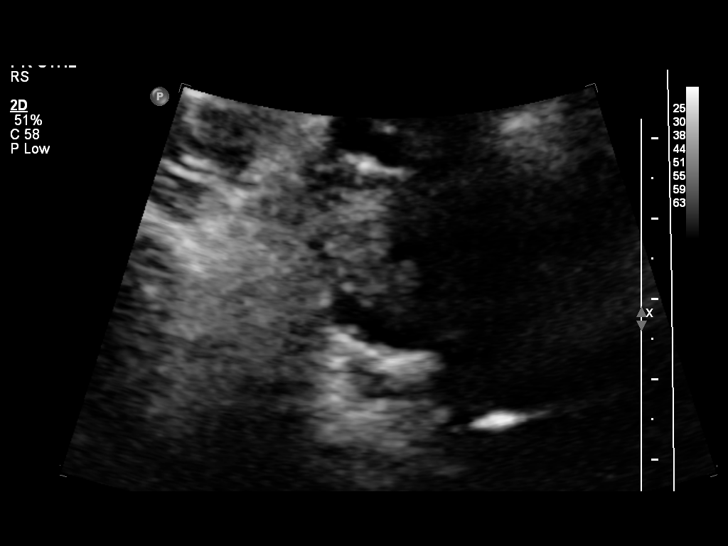
[im 22/64]
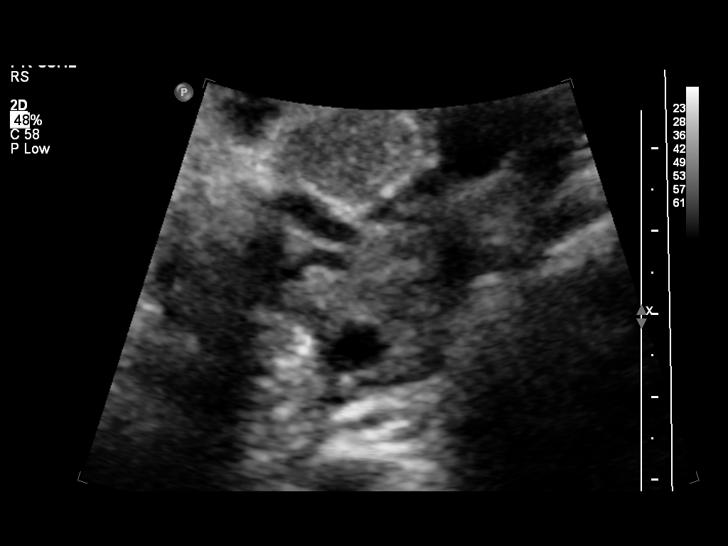
[im 26/64]
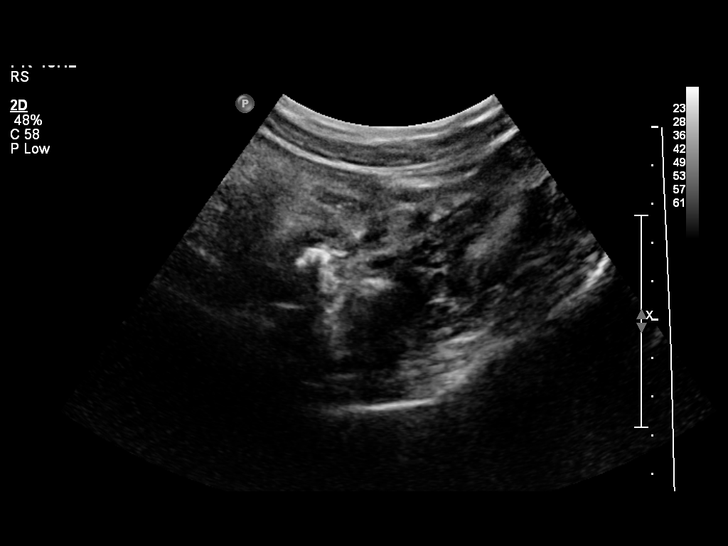
[im 33/64]
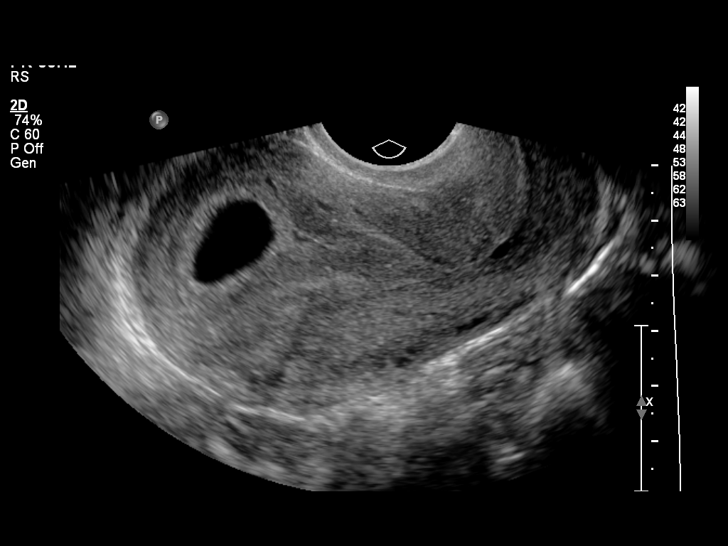
[im 38/64]
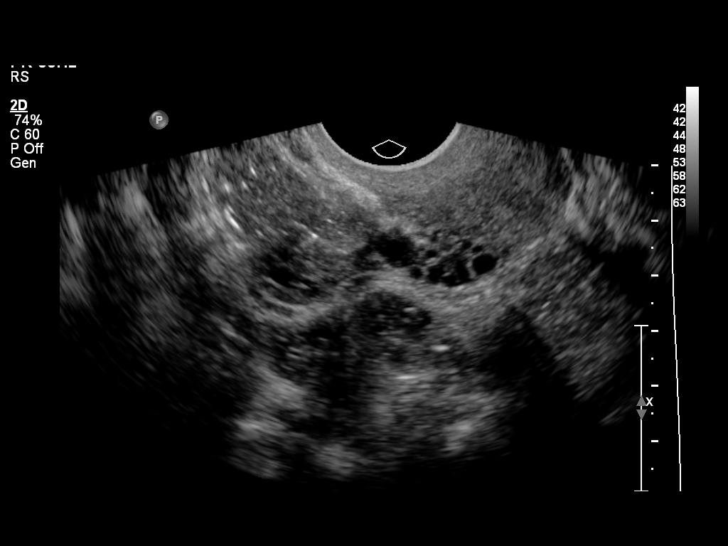
[im 43/64]
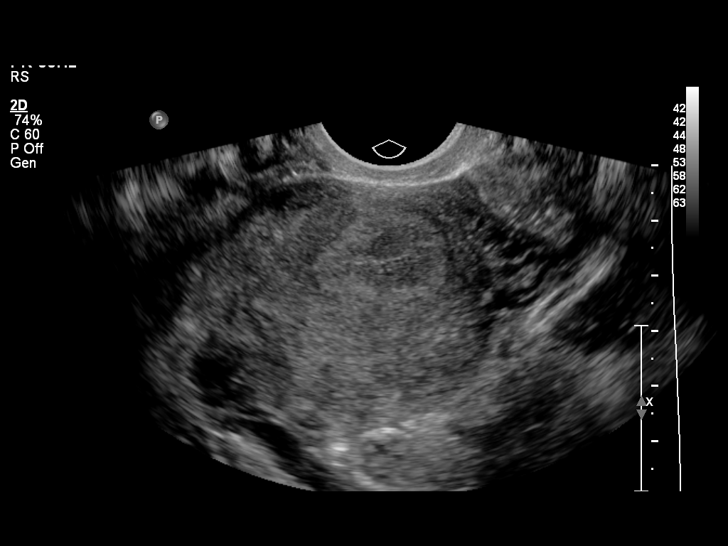
[im 47/64]
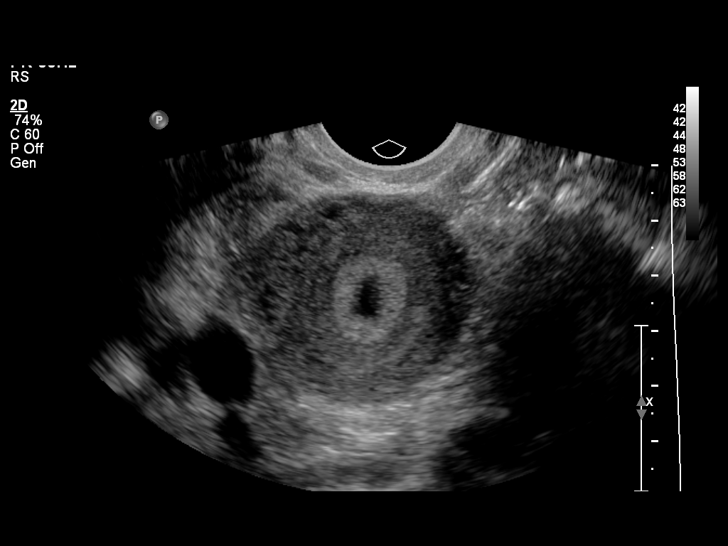
[im 52/64]
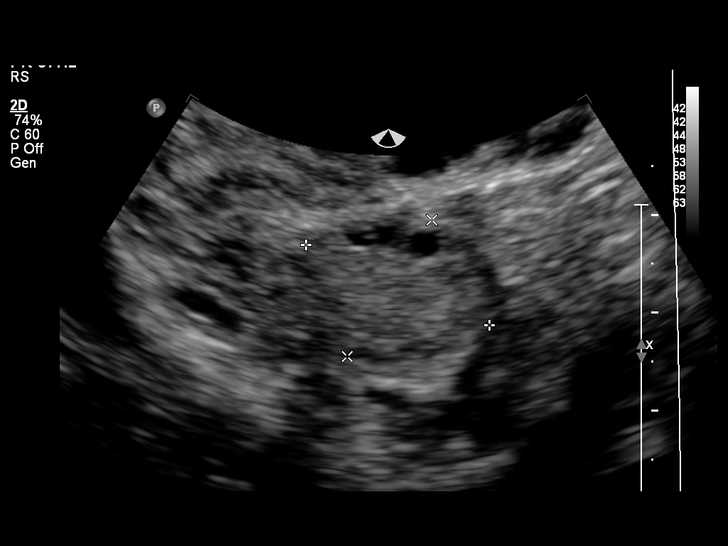
[im 57/64]
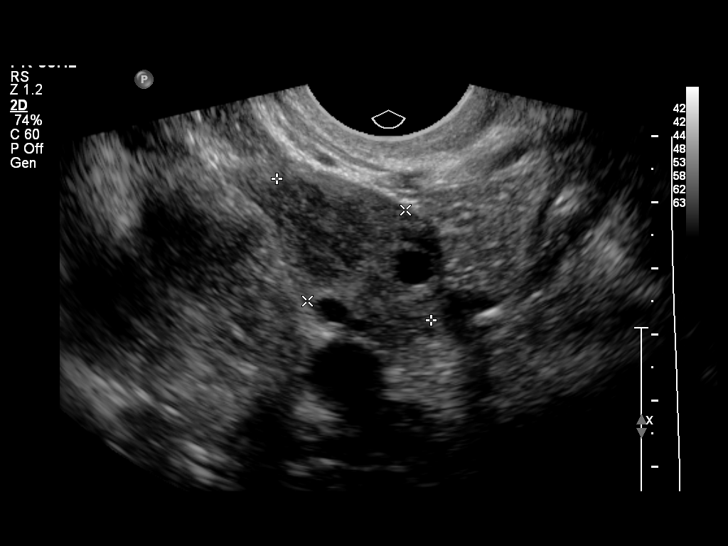
[im 61/64]
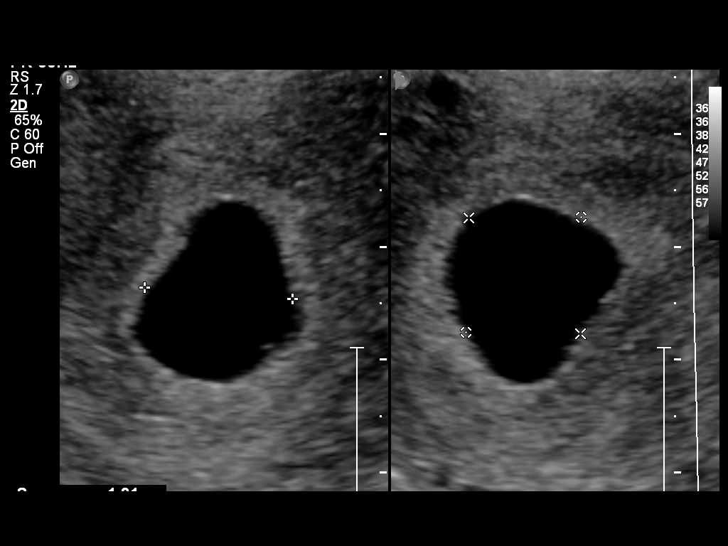

[13 of 28 positions shown; findings below may reference images not displayed]

FINDINGS: Intrauterine gestational sac: Single, normal in appearance.

Yolk sac:  Identified.

Embryo:  Not identified.

Cardiac Activity: Not applicable.

MSD:  13.9  mm   6 w   1 d

US EDC: 10/11/2014

Maternal uterus/adnexae: Small subchorionic hemorrhage. Both ovaries
normal in size and appearance, containing small follicular cysts.
Trace free fluid in the cul-de-sac.
IMPRESSION: 1. Early intrauterine gestational sac and yolk sac, but no fetal
pole or cardiac activity yet visualized. Please correlate with the
current beta HCG level. Recommend follow-up quantitative B-HCG
levels and follow-up US in 14 days to confirm and assess viability.
This recommendation follows SRU consensus guidelines: Diagnostic
Criteria for Nonviable Pregnancy Early in the First Trimester. N
Engl J Med 1620; [DATE].
2. Small subchorionic hemorrhage.
3. Normal-appearing ovaries. No adnexal masses. Trace free fluid in
the cul-de-sac.

## 2018-03-09 ENCOUNTER — Encounter: Payer: Self-pay | Admitting: General Practice

## 2018-03-09 ENCOUNTER — Ambulatory Visit (INDEPENDENT_AMBULATORY_CARE_PROVIDER_SITE_OTHER): Payer: Self-pay | Admitting: *Deleted

## 2018-03-09 DIAGNOSIS — Z3201 Encounter for pregnancy test, result positive: Secondary | ICD-10-CM

## 2018-03-09 DIAGNOSIS — Z3200 Encounter for pregnancy test, result unknown: Secondary | ICD-10-CM

## 2018-03-09 LAB — POCT PREGNANCY, URINE: Preg Test, Ur: POSITIVE — AB

## 2018-03-09 NOTE — Progress Notes (Addendum)
Pt informed of +UPT today.  LMP 01/05/18.  EDD 10/12/18. Medication reconciliation completed.  Pt stated she has nausea.  She was recommended Unisom OTC. Pt will schedule prenatal care.   Have reviewed the nurse's note and agree with the plan of care. Nolene BernheimERRI BURLESON, RN, MSN, NP-BC Nurse Practitioner, [facility], [facility] 03/09/2018 10:08 AM

## 2018-03-30 ENCOUNTER — Encounter: Payer: Medicaid Other | Admitting: Medical

## 2018-03-30 ENCOUNTER — Encounter: Payer: Self-pay | Admitting: Medical

## 2018-03-30 NOTE — BH Specialist Note (Deleted)
Integrated Behavioral Health Initial Visit  MRN: [id number] Name: Veronica RutherfordYolonda L Zhang  Number of Integrated Behavioral Health Clinician visits:: 1/6 Session Start time: ***  Session End time: *** Total time: {IBH Total Time:21014050}  Type of Service: Integrated Behavioral Health- Individual/Family Interpretor:{yes [id number]}no:[id number]} Interpretor Name and Language: n/a   Warm Hand Off Completed.       SUBJECTIVE: Veronica Zhang is a 32 y.o. female accompanied by {CHL AMB ACCOMPANIED YN:[id number]}BY:[phone]} Patient was referred by Vonzella NippleJulie Wenzel, PA-C  for initial OB introduction to integrated behavioral health services ***. Patient reports the following symptoms/concerns: *** Duration of problem: ***; Severity of problem: {Mild/Moderate/Severe:20260}  OBJECTIVE: Mood: {BHH MOOD:22306} and Affect: {BHH AFFECT:22307} Risk of harm to self or others: {CHL AMB BH Suicide Current Mental Status:21022748}  LIFE CONTEXT: Family and Social: *** School/Work: *** Self-Care: *** Life Changes: Current pregnancy ***  GOALS ADDRESSED: Patient will: 1. Reduce symptoms of: {IBH Symptoms:21014056} 2. Increase knowledge and/or ability of: {IBH Patient Tools:21014057}  3. Demonstrate ability to: {IBH Goals:21014053}  INTERVENTIONS: Interventions utilized: {IBH Interventions:21014054}  Standardized Assessments completed: {IBH Screening Tools:21014051}  ASSESSMENT: Patient currently experiencing Supervision of *** pregnancy, antepartum ***   Patient may benefit from initial OB introduction to integrated behavioral health services ***.  PLAN: 1. Follow up with behavioral health clinician on : *** 2. Behavioral recommendations: *** 3. Referral(s): {IBH Referrals:21014055} 4. "From scale of 1-10, how likely are you to follow plan?": ***  Lannie Heaps C Mylissa Lambe, LCSW  No flowsheet data found.

## 2020-03-13 ENCOUNTER — Ambulatory Visit: Payer: Medicaid Other | Admitting: Physical Therapy
